# Patient Record
Sex: Male | Born: 1935 | Race: White | Hispanic: No | Marital: Married | Smoking: Former smoker
Health system: Southern US, Community
[De-identification: ages and names within clinical notes are randomized; demographics above are authoritative.]

## PROBLEM LIST (undated history)

## (undated) DIAGNOSIS — M069 Rheumatoid arthritis, unspecified: Secondary | ICD-10-CM

## (undated) DIAGNOSIS — I10 Essential (primary) hypertension: Secondary | ICD-10-CM

## (undated) DIAGNOSIS — K219 Gastro-esophageal reflux disease without esophagitis: Secondary | ICD-10-CM

## (undated) DIAGNOSIS — M0520 Rheumatoid vasculitis with rheumatoid arthritis of unspecified site: Secondary | ICD-10-CM

## (undated) HISTORY — PX: HERNIA REPAIR: SHX51

## (undated) HISTORY — PX: APPENDECTOMY: SHX54

## (undated) HISTORY — PX: BACK SURGERY: SHX140

## (undated) HISTORY — PX: TONSILLECTOMY: SUR1361

## (undated) HISTORY — PX: COLON SURGERY: SHX602

---

## 2009-09-24 ENCOUNTER — Ambulatory Visit: Payer: Self-pay | Admitting: Rheumatology

## 2009-12-25 ENCOUNTER — Ambulatory Visit: Payer: Self-pay | Admitting: Otolaryngology

## 2010-04-08 ENCOUNTER — Ambulatory Visit: Payer: Self-pay | Admitting: Oncology

## 2010-04-30 ENCOUNTER — Ambulatory Visit: Payer: Self-pay | Admitting: Oncology

## 2010-05-08 ENCOUNTER — Ambulatory Visit: Payer: Self-pay | Admitting: Oncology

## 2010-06-08 ENCOUNTER — Ambulatory Visit: Payer: Self-pay | Admitting: Oncology

## 2010-07-09 ENCOUNTER — Ambulatory Visit: Payer: Self-pay | Admitting: Oncology

## 2010-08-08 ENCOUNTER — Ambulatory Visit: Payer: Self-pay | Admitting: Oncology

## 2010-09-08 ENCOUNTER — Ambulatory Visit: Payer: Self-pay | Admitting: Oncology

## 2013-04-23 ENCOUNTER — Ambulatory Visit: Payer: Self-pay | Admitting: Rheumatology

## 2014-06-20 ENCOUNTER — Emergency Department: Payer: Self-pay | Admitting: Emergency Medicine

## 2014-06-20 LAB — URINALYSIS, COMPLETE
BACTERIA: NONE SEEN
BLOOD: NEGATIVE
Bilirubin,UR: NEGATIVE
GLUCOSE, UR: NEGATIVE mg/dL (ref 0–75)
Ketone: NEGATIVE
Leukocyte Esterase: NEGATIVE
NITRITE: NEGATIVE
PH: 5 (ref 4.5–8.0)
Protein: NEGATIVE
RBC, UR: NONE SEEN /HPF (ref 0–5)
SPECIFIC GRAVITY: 1.011 (ref 1.003–1.030)
Squamous Epithelial: <1

## 2014-06-20 LAB — CBC WITH DIFFERENTIAL/PLATELET
Basophil #: 0 10*3/uL (ref 0.0–0.1)
Basophil %: 0.7 %
Eosinophil #: 0.3 10*3/uL (ref 0.0–0.7)
Eosinophil %: 4.7 %
HCT: 33.9 % — ABNORMAL LOW (ref 40.0–52.0)
HGB: 11.4 g/dL — ABNORMAL LOW (ref 13.0–18.0)
LYMPHS ABS: 1.5 10*3/uL (ref 1.0–3.6)
Lymphocyte %: 27.2 %
MCH: 32 pg (ref 26.0–34.0)
MCHC: 33.8 g/dL (ref 32.0–36.0)
MCV: 95 fL (ref 80–100)
MONO ABS: 0.2 x10 3/mm (ref 0.2–1.0)
Monocyte %: 3.9 %
NEUTROS ABS: 3.5 10*3/uL (ref 1.4–6.5)
NEUTROS PCT: 63.5 %
PLATELETS: 271 10*3/uL (ref 150–440)
RBC: 3.57 10*6/uL — ABNORMAL LOW (ref 4.40–5.90)
RDW: 16.5 % — ABNORMAL HIGH (ref 11.5–14.5)
WBC: 5.5 10*3/uL (ref 3.8–10.6)

## 2014-06-20 LAB — COMPREHENSIVE METABOLIC PANEL
ALT: 45 U/L
Albumin: 3.6 g/dL (ref 3.4–5.0)
Alkaline Phosphatase: 93 U/L
Anion Gap: 9 (ref 7–16)
BUN: 19 mg/dL — ABNORMAL HIGH (ref 7–18)
Bilirubin,Total: 0.4 mg/dL (ref 0.2–1.0)
CALCIUM: 9.2 mg/dL (ref 8.5–10.1)
CO2: 23 mmol/L (ref 21–32)
Chloride: 107 mmol/L (ref 98–107)
Creatinine: 1.47 mg/dL — ABNORMAL HIGH (ref 0.60–1.30)
EGFR (African American): 53 — ABNORMAL LOW
EGFR (Non-African Amer.): 46 — ABNORMAL LOW
Glucose: 121 mg/dL — ABNORMAL HIGH (ref 65–99)
OSMOLALITY: 281 (ref 275–301)
Potassium: 3.4 mmol/L — ABNORMAL LOW (ref 3.5–5.1)
SGOT(AST): 27 U/L (ref 15–37)
SODIUM: 139 mmol/L (ref 136–145)
TOTAL PROTEIN: 7.4 g/dL (ref 6.4–8.2)

## 2016-08-15 ENCOUNTER — Encounter (HOSPITAL_COMMUNITY): Payer: Self-pay

## 2016-08-15 ENCOUNTER — Emergency Department (HOSPITAL_COMMUNITY)
Admission: EM | Admit: 2016-08-15 | Discharge: 2016-08-15 | Disposition: A | Discharge status: Home / Self Care | Payer: Medicare Other | Attending: Emergency Medicine | Admitting: Emergency Medicine

## 2016-08-15 DIAGNOSIS — I10 Essential (primary) hypertension: Secondary | ICD-10-CM | POA: Insufficient documentation

## 2016-08-15 DIAGNOSIS — Z87891 Personal history of nicotine dependence: Secondary | ICD-10-CM | POA: Insufficient documentation

## 2016-08-15 DIAGNOSIS — L03211 Cellulitis of face: Secondary | ICD-10-CM

## 2016-08-15 DIAGNOSIS — R220 Localized swelling, mass and lump, head: Secondary | ICD-10-CM | POA: Diagnosis present

## 2016-08-15 HISTORY — DX: Essential (primary) hypertension: I10

## 2016-08-15 HISTORY — DX: Gastro-esophageal reflux disease without esophagitis: K21.9

## 2016-08-15 HISTORY — DX: Rheumatoid arthritis, unspecified: M06.9

## 2016-08-15 HISTORY — DX: Rheumatoid vasculitis with rheumatoid arthritis of unspecified site: M05.20

## 2016-08-15 MED ORDER — SULFAMETHOXAZOLE-TRIMETHOPRIM 800-160 MG PO TABS: 1.0000 | ORAL_TABLET | Freq: Two times a day (BID) | ORAL | 0 refills | Status: AC

## 2016-08-15 MED ORDER — SULFAMETHOXAZOLE-TRIMETHOPRIM 800-160 MG PO TABS
1.0000 | ORAL_TABLET | Freq: Once | ORAL | Status: AC
  Administered 2016-08-15: 1 via ORAL
  Filled 2016-08-15: qty 1

## 2016-08-15 NOTE — ED Triage Notes (Signed)
Pt reports that he has had redness to right cheek area for 2 weeks.Area started swelling last night. No pain

## 2016-08-15 NOTE — ED Provider Notes (Signed)
[facility] Provider Note   CSN: [id number] Arrival date & time: 08/15/16  0919     History   Chief Complaint Chief Complaint  Patient presents with  . Abscess    HPI William Tyler is a 81 y.o. male.  HPI   William Tyler is a 81 y.o. male who presents to the Emergency Department complaining of localized redness of the right cheek. Symptoms have been present for 2 weeks. He states this morning that he woke up and noticed the area of redness seemed thicker and swollen.  He denies pain to the area, fever, chills, dental pain or ear pain. He also denies any visual changes.  He has not tried any therapies for his symptoms. He does state that he occasionally switches from using an Neurosurgeonelectric razor to a regular razor   Past Medical History:  Diagnosis Date  . GERD (gastroesophageal reflux disease)   . Hypertension   . Rheumatoid arteritis   . Rheumatoid arthritis (HCC)     There are no active problems to display for this patient.   Past Surgical History:  Procedure Laterality Date  . APPENDECTOMY    . BACK SURGERY    . COLON SURGERY    . HERNIA REPAIR    . TONSILLECTOMY         Home Medications    Prior to Admission medications   Not on File    Family History History reviewed. No pertinent family history.  Social History Social History  Substance Use Topics  . Smoking status: Former Games developermoker  . Smokeless tobacco: Never Used  . Alcohol use No     Allergies   Augmentin [amoxicillin-pot clavulanate] and Doxycycline   Review of Systems Review of Systems  Constitutional: Negative for chills and fever.  Gastrointestinal: Negative for nausea and vomiting.  Musculoskeletal: Negative for arthralgias and joint swelling.  Skin: Positive for color change.       Redness of the right cheek  Neurological: Negative for dizziness, speech difficulty, weakness and headaches.  Hematological: Negative for adenopathy.  All other systems reviewed and are  negative.    Physical Exam Updated Vital Signs BP 139/71 (BP Location: Left Arm)   Pulse (!) 54   Temp 98.9 F (37.2 C) (Oral)   Resp 16   Ht 5\' 9"  (1.753 m)   Wt 83.9 kg   SpO2 98%   BMI 27.32 kg/m   Physical Exam  Constitutional: He is oriented to person, place, and time. He appears well-developed and well-nourished. No distress.  HENT:  Head: Normocephalic and atraumatic.    Right Ear: Tympanic membrane and ear canal normal.  Left Ear: Tympanic membrane and ear canal normal.  Mouth/Throat: Uvula is midline, oropharynx is clear and moist and mucous membranes are normal. No trismus in the jaw.  5 cm area of focal erythema and mild induration of the right face. No fluctuance or drainable abscess noted. Erythema does not extend into the orbital region. No dental tenderness or fluctuance of the gingiva   Eyes: Conjunctivae and EOM are normal. Pupils are equal, round, and reactive to light.  Neck: Normal range of motion. Neck supple. No tracheal deviation present.  Cardiovascular: Normal rate, regular rhythm and normal heart sounds.   No murmur heard. Pulmonary/Chest: Effort normal and breath sounds normal. No respiratory distress.  Lymphadenopathy:    He has no cervical adenopathy.  Neurological: He is alert and oriented to person, place, and time. Abnormal muscle tone:   Coordination normal.  Skin: Skin is warm and dry.  Psychiatric: He has a normal mood and affect.  Nursing note and vitals reviewed.    ED Treatments / Results  Labs (all labs ordered are listed, but only abnormal results are displayed) Labs Reviewed - No data to display  EKG  EKG Interpretation None       Radiology No results found.  Procedures Procedures (including critical care time)  Medications Ordered in ED Medications - No data to display   Initial Impression / Assessment and Plan / ED Course  I have reviewed the triage vital signs and the nursing notes.  Pertinent labs &  imaging results that were available during my care of the patient were reviewed by me and considered in my medical decision making (see chart for details).  Clinical Course    Patient is well-appearing. Vital signs are stable. Localized erythema of the right face without obvious abscess, consistent with cellulitis. Does not extend into the orbital region.  Patient also seen by Dr. Clayborne Dana and care plan discussed.  Patient stable for discharge, he agrees to warm compresses prescription for Bactrim and agrees to ER return if needed.  Final Clinical Impressions(s) / ED Diagnoses   Final diagnoses:  Cellulitis of face    New Prescriptions New Prescriptions   No medications on file     Pauline Aus, PA-C 08/15/16 1039    Marily Memos, MD 08/15/16 [phone]

## 2016-08-15 NOTE — ED Provider Notes (Signed)
Medical screening examination/treatment/procedure(s) were conducted as a shared visit with non-physician practitioner(s) and myself.  I personally evaluated the patient during the encounter.   A couple weeks of redness and tenderness under right eye. Yesterday became 'swollen' and patient concerned for abscess so came for eval.  On my eval, it is indurated without fluctuance. No EOM pain. No change in vision. No dental issues.  Likely cellulitis, low concern for abscess. Plan for outpatient antibiotics. Return if not improvig in 3-5 days or starts to feel fluctuance.    Marily MemosJason Kieley Akter, MD 08/15/16 [phone]

## 2016-08-15 NOTE — Discharge Instructions (Signed)
Frequent warm wet compresses on and off to your face.  Follow-up with your primary doctor or return here if symptoms are not improving.

## 2019-11-07 ENCOUNTER — Other Ambulatory Visit: Payer: Self-pay | Admitting: Rheumatology

## 2019-11-07 DIAGNOSIS — M25512 Pain in left shoulder: Secondary | ICD-10-CM

## 2019-11-11 ENCOUNTER — Ambulatory Visit
Admission: RE | Admit: 2019-11-11 | Discharge: 2019-11-11 | Disposition: A | Discharge status: Home / Self Care | Payer: Medicare HMO | Source: Ambulatory Visit | Attending: Rheumatology | Admitting: Rheumatology

## 2019-11-11 ENCOUNTER — Other Ambulatory Visit: Payer: Self-pay

## 2019-11-11 DIAGNOSIS — M25512 Pain in left shoulder: Secondary | ICD-10-CM | POA: Diagnosis present

## 2020-06-18 ENCOUNTER — Other Ambulatory Visit: Payer: Self-pay

## 2020-06-18 ENCOUNTER — Encounter: Payer: Self-pay | Admitting: Urology

## 2020-06-18 ENCOUNTER — Ambulatory Visit: Payer: Medicare HMO | Admitting: Urology

## 2020-06-18 VITALS — BP 98/59 | HR 116 | Ht 69.0 in | Wt 185.0 lb

## 2020-06-18 DIAGNOSIS — N5201 Erectile dysfunction due to arterial insufficiency: Secondary | ICD-10-CM

## 2020-06-18 NOTE — Progress Notes (Signed)
06/18/2020 3:15 PM   William Tyler 1935/08/29 [id number]  Referring provider: Alvina Filbert, MD [address] [place],  [place] [address]  Chief Complaint  Patient presents with   Erectile Dysfunction    HPI: William Tyler is an 85 y.o. male seen at the request of Alvina Filbert, MD for evaluation of erectile dysfunction.   Long history of a ED, was using Viagra which was effective however at age 57 he could no longer afford  Complains of difficulty achieving and maintaining an erection  Has been prescribed sildenafil 20 mg daily and tadalafil 5 mg daily which has not been effective  Has taken up to 60 mg sildenafil and 10 mg tadalafil without improvement  No pain or curvature with erections  Organic risk factors include hypertension, antihypertensive medications and tobacco history  PMH: Past Medical History:  Diagnosis Date   GERD (gastroesophageal reflux disease)    Hypertension    Rheumatoid arteritis (HCC)    Rheumatoid arthritis (HCC)     Surgical History: Past Surgical History:  Procedure Laterality Date   APPENDECTOMY     BACK SURGERY     COLON SURGERY     HERNIA REPAIR     TONSILLECTOMY      Home Medications:  Allergies as of 06/18/2020      Reactions   Augmentin [amoxicillin-pot Clavulanate] Rash   Doxycycline Rash      Medication List       Accurate as of June 18, 2020  3:15 PM. If you have any questions, ask your nurse or doctor.        amLODipine 10 MG tablet Commonly known as: NORVASC Take by mouth.   fluticasone 50 MCG/ACT nasal spray Commonly known as: FLONASE   GNP Vitamin B-1 100 MG tablet Generic drug: thiamine   hydrochlorothiazide 12.5 MG tablet Commonly known as: HYDRODIURIL hydrochlorothiazide 12.5 mg tablet   hydrocortisone 20 MG tablet Commonly known as: CORTEF hydrocortisone 20 mg tablet   lansoprazole 30 MG capsule Commonly known as: PREVACID Take 1 capsule by mouth 2 (two) times daily.     methotrexate 2.5 MG tablet Commonly known as: RHEUMATREX methotrexate sodium 2.5 mg tablet   tamsulosin 0.4 MG Caps capsule Commonly known as: FLOMAX Take by mouth.       Allergies:  Allergies  Allergen Reactions   Augmentin [Amoxicillin-Pot Clavulanate] Rash   Doxycycline Rash    Family History: No family history on file.  Social History:  reports that he has quit smoking. He has never used smokeless tobacco. He reports that he does not drink alcohol and does not use drugs.   Physical Exam: BP (!) 98/59    Pulse (!) 116    Ht 5\' 9"  (1.753 m)    Wt 185 lb (83.9 kg)    BMI 27.32 kg/m   Constitutional:  Alert and oriented, No acute distress. HEENT: [place] AT, moist mucus membranes.  Trachea midline, no masses. Cardiovascular: No clubbing, cyanosis, or edema. Respiratory: Normal respiratory effort, no increased work of breathing. GI: Abdomen is soft, nontender, nondistended, no abdominal masses GU: Phallus without lesions, testes descended bilaterally Skin: No rashes, bruises or suspicious lesions. Neurologic: Grossly intact, no focal deficits, moving all 4 extremities. Psychiatric: Normal mood and affect.   Assessment & Plan:    1.  Erectile dysfunction  No improvement: PDE 5 inhibitors however his dosing has been suboptimal  He was informed he could titrate sildenafil to a max of 100 mg or tadalafil to  a max of 20 mg.  He states he does have some of the medication left and will call back if he needs refill  Other options were discussed including intracavernosal injections, vacuum erection devices and penile implant surgery.  He was provided literature and will call back if interested in pursuing any of these options.   Riki Altes, MD  [facility] [address] [place], [place] [address] [phone]

## 2020-06-19 ENCOUNTER — Encounter: Payer: Self-pay | Admitting: Urology

## 2021-05-20 IMAGING — MR MR SHOULDER*L* W/O CM
6 series · 40 of 40 positions shown · non-contrast
Comparison: None.

CLINICAL DATA: Left shoulder pain for 3 months

EXAM:
MRI OF THE LEFT SHOULDER WITHOUT CONTRAST
TECHNIQUE: Multiplanar, multisequence MR imaging of the shoulder was performed.
No intravenous contrast was administered.

[Series 3: PD fat-sat · axial · left · 4.0mm · 0.55mm/px · z∈[-34,+81]mm · 8 of 25 slices shown (1 of 2)]
[im 1/25]
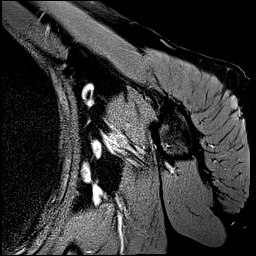
[im 4/25]
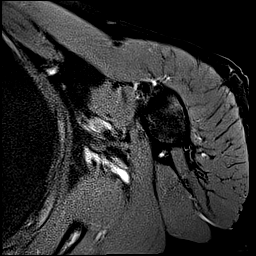
[im 7/25]
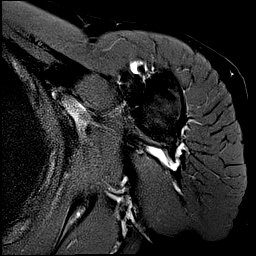
[im 11/25]
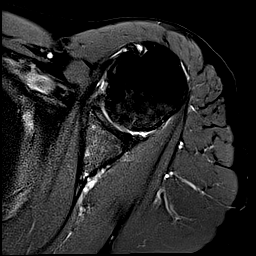
[im 14/25]
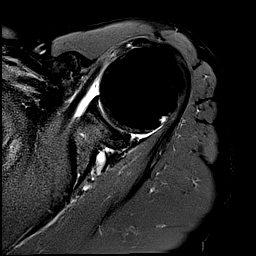
[im 18/25]
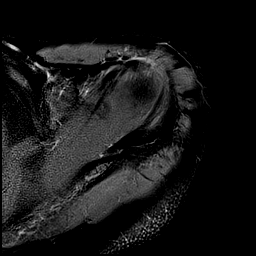
[im 21/25]
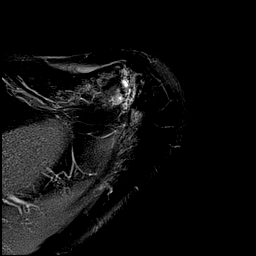
[im 25/25]
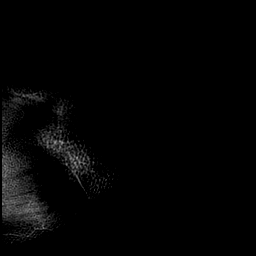

[Series 4: PD fat-sat · oblique · left · 4.0mm · 0.44mm/px · 7 of 26 slices shown (2 of 2)]
[im 1/26]
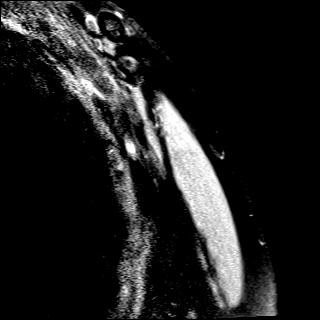
[im 5/26]
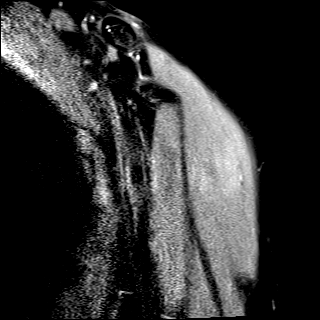
[im 9/26]
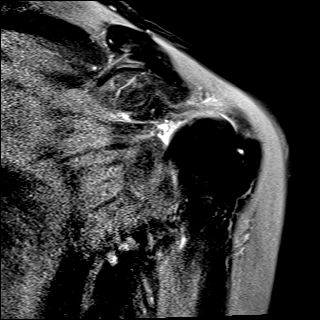
[im 13/26]
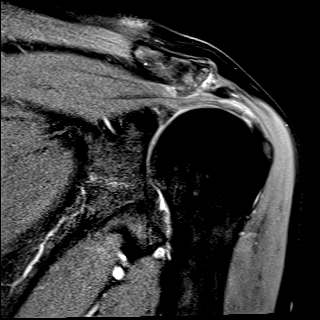
[im 17/26]
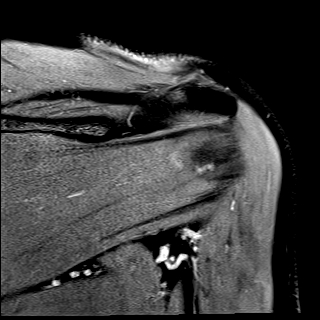
[im 21/26]
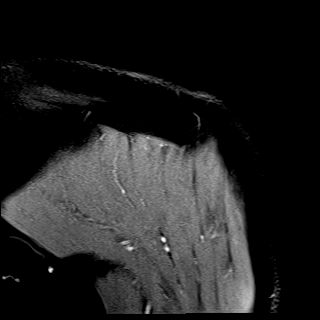
[im 26/26]
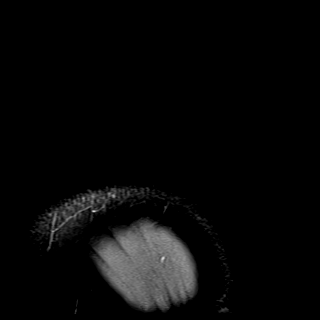

[Series 5: T2 fat-sat · oblique · left · 4.0mm · 0.44mm/px · 7 of 26 slices shown (1 of 3)]
[im 1/26]
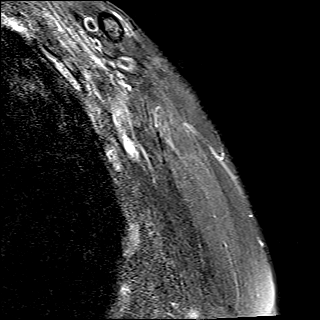
[im 5/26]
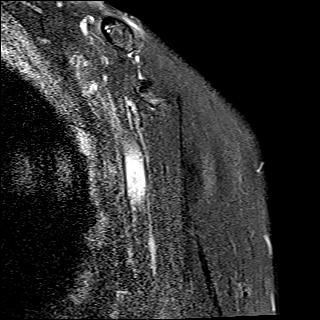
[im 9/26]
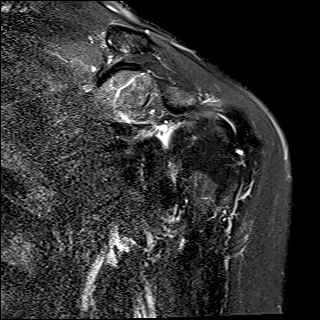
[im 13/26]
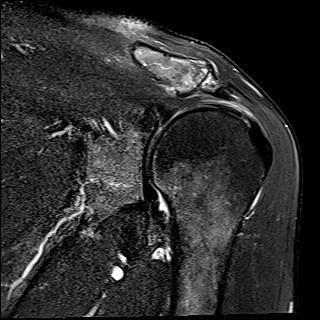
[im 17/26]
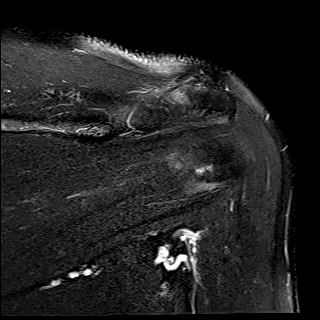
[im 21/26]
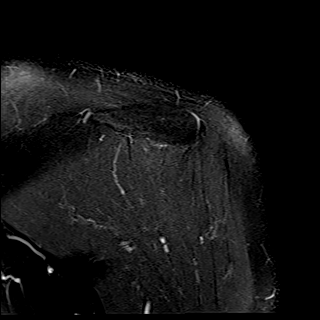
[im 26/26]
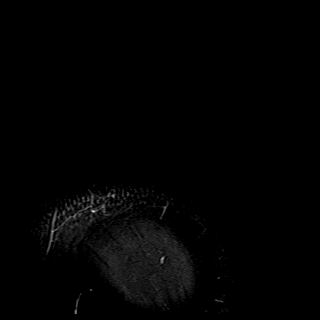

[Series 6: T2 fat-sat · oblique · left · 4.0mm · 0.27mm/px · 6 of 22 slices shown (2 of 3)]
[im 1/22]
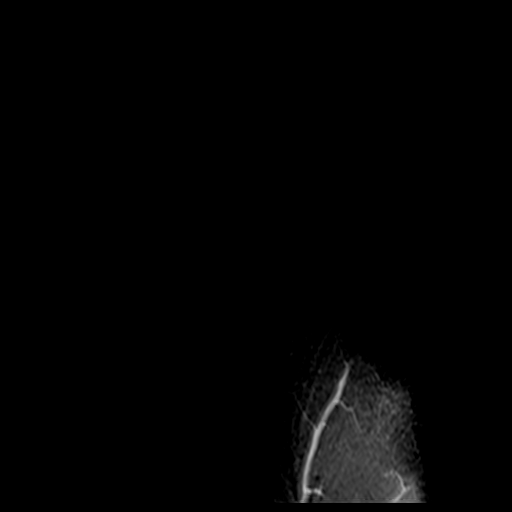
[im 5/22]
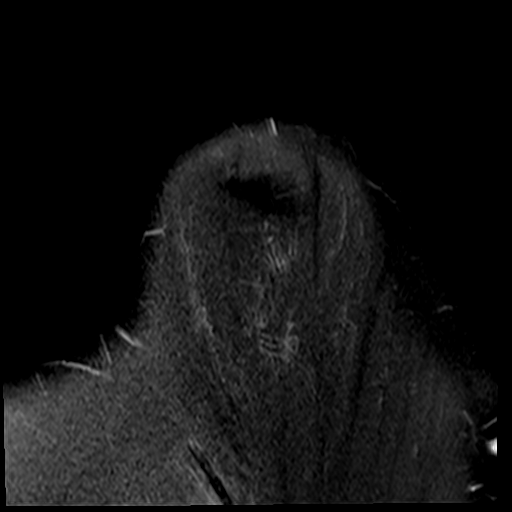
[im 9/22]
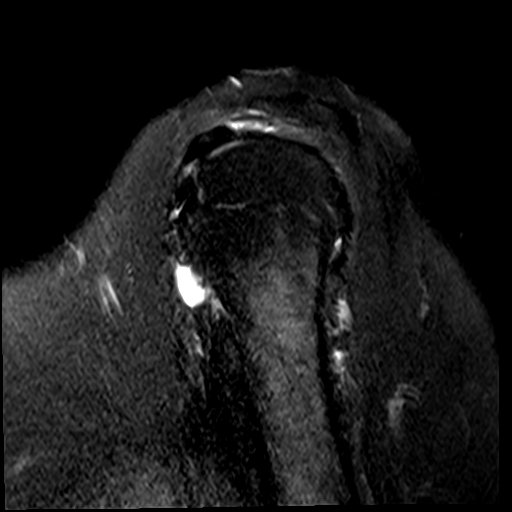
[im 13/22]
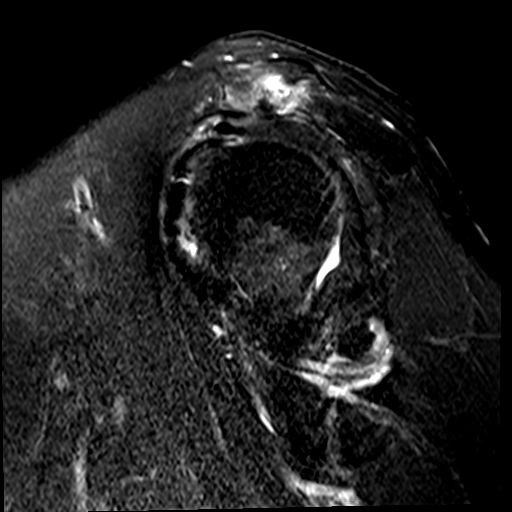
[im 17/22]
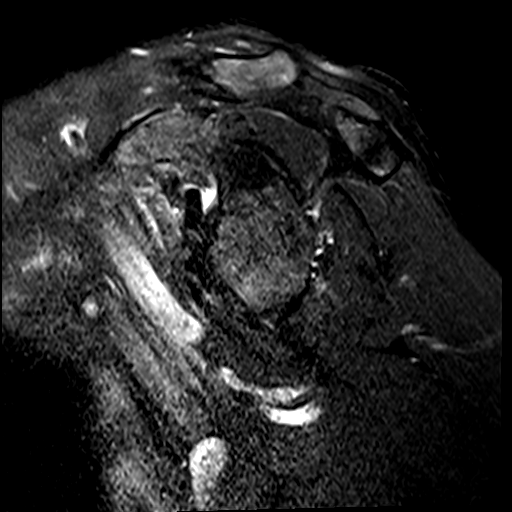
[im 22/22]
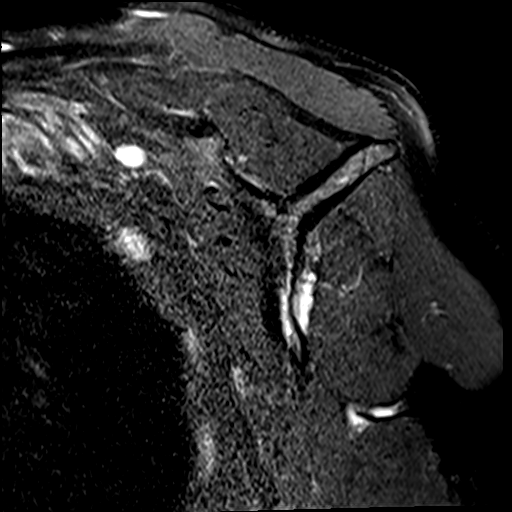

[Series 7: T1 · oblique · left · 4.0mm · 0.44mm/px · 6 of 22 slices shown]
[im 1/22]
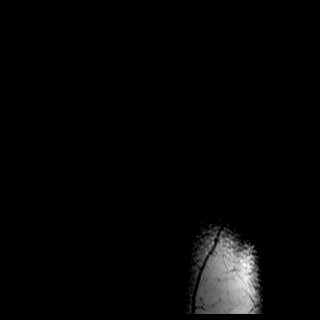
[im 5/22]
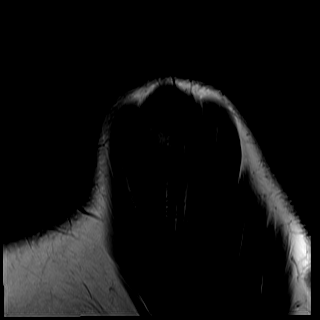
[im 9/22]
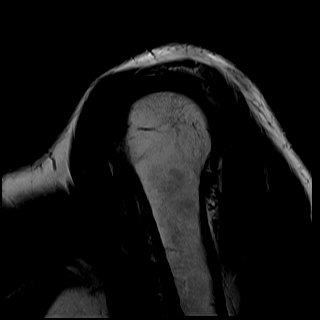
[im 13/22]
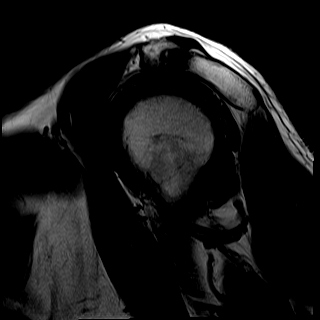
[im 17/22]
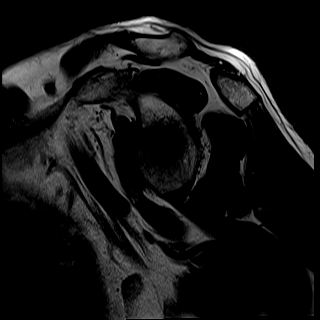
[im 22/22]
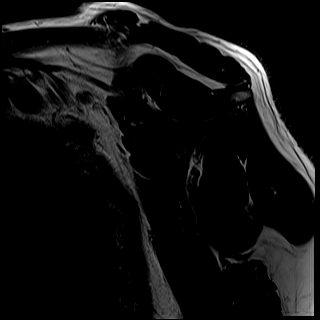

[Series 8: T2 fat-sat · oblique · left · 4.0mm · 0.55mm/px · 6 of 22 slices shown (3 of 3)]
[im 1/22]
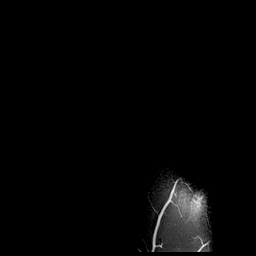
[im 5/22]
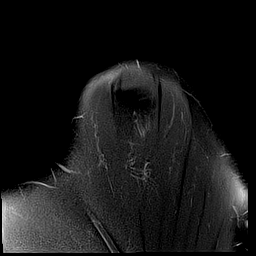
[im 9/22]
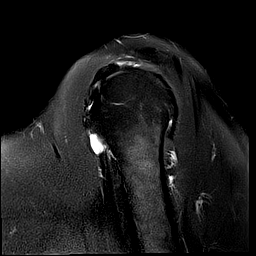
[im 13/22]
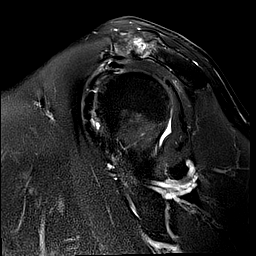
[im 17/22]
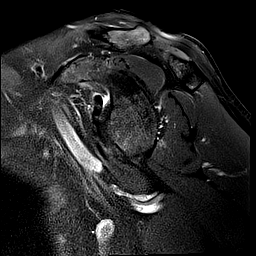
[im 22/22]
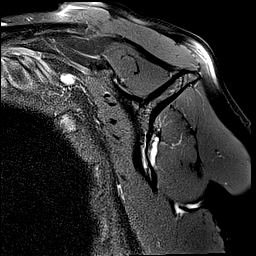

[40 of 40 positions shown; findings below may reference images not displayed]

FINDINGS: Rotator cuff: Mild supraspinatus tendinosis without discrete tear.
Subscapularis tendinosis with interstitial tear. Infraspinatus and
teres minor tendons are intact.

Muscles: No atrophy or abnormal signal of the muscles of the rotator
cuff.

Biceps long head:  Intra-articular biceps tendinosis.

Acromioclavicular Joint: Mild arthropathy of the acromioclavicular
joint. No subacromial/subdeltoid bursal fluid.

Glenohumeral Joint: Trace joint effusion. Mild chondral thinning
without discrete cartilage defect.

Labrum: Irregular posterior labral tear without definite involvement
of the superior labrum. Anteroinferior labrum appears mildly
degenerated.

Bones:  No marrow abnormality, fracture or dislocation.

Other: None.
IMPRESSION: 1. Subscapularis tendinosis with interstitial tear.
2. Mild supraspinatus tendinosis without discrete tear.
3. Intra-articular biceps tendinosis.
4. Mild glenohumeral osteoarthritis with trace joint effusion and
posterior labral tear.
5. Mild acromioclavicular osteoarthritis.

## 2022-01-14 ENCOUNTER — Other Ambulatory Visit
Admission: RE | Admit: 2022-01-14 | Discharge: 2022-01-14 | Disposition: A | Discharge status: Home / Self Care | Payer: Medicare HMO | Source: Ambulatory Visit | Attending: Rheumatology | Admitting: Rheumatology

## 2022-01-14 DIAGNOSIS — M25561 Pain in right knee: Secondary | ICD-10-CM | POA: Insufficient documentation

## 2022-01-14 LAB — SYNOVIAL CELL COUNT + DIFF, W/ CRYSTALS
Crystals, Fluid: NONE SEEN
Eosinophils-Synovial: 0 %
Lymphocytes-Synovial Fld: 25 %
Monocyte-Macrophage-Synovial Fluid: 45 %
Neutrophil, Synovial: 30 %
Other Cells-SYN: 0
WBC, Synovial: 50 /mm3 (ref 0–200)

## 2022-05-18 ENCOUNTER — Ambulatory Visit (INDEPENDENT_AMBULATORY_CARE_PROVIDER_SITE_OTHER): Payer: Medicare HMO | Admitting: Nurse Practitioner

## 2022-05-18 ENCOUNTER — Encounter (INDEPENDENT_AMBULATORY_CARE_PROVIDER_SITE_OTHER): Payer: Self-pay | Admitting: Vascular Surgery

## 2022-05-18 VITALS — BP 154/78 | HR 89 | Resp 17 | Ht 69.0 in | Wt 170.0 lb

## 2022-05-18 DIAGNOSIS — I7143 Infrarenal abdominal aortic aneurysm, without rupture: Secondary | ICD-10-CM | POA: Diagnosis not present

## 2022-05-18 DIAGNOSIS — R29898 Other symptoms and signs involving the musculoskeletal system: Secondary | ICD-10-CM | POA: Diagnosis not present

## 2022-05-18 MED ORDER — CAPSAICIN 0.075 % EX CREA: 1.0000 | TOPICAL_CREAM | Freq: Two times a day (BID) | CUTANEOUS | 0 refills | Status: AC

## 2022-05-30 NOTE — Progress Notes (Signed)
Subjective:    Patient ID: William Tyler, male    DOB: 11-21-1934, 87 y.o.   MRN: [id number] Chief Complaint  Patient presents with   Establish Care    Referred by William Tyler is an 87 year old male who presents today for evaluation of an abdominal aortic aneurysm.  The abdominal aorta was measured at 2.6 cm at its largest point.  There was a 1.1 cm left iliac artery measurement with a 1.5 cm right common iliac artery measurement.  The patient is also largely concerned that he may have vascular issues as during an ultrasound he was told that he had a blockages in his aorta.  The patient notes that he has severe fatigue and weakness with walking.  He notes that this happened after hospitalization for COVID.  This has been sometime ago but the patient notes that he never fully felt like himself once again.  He notes that he gets extremely tired with ambulation.  He does not have any claudication-like symptoms.  He also notes that his legs feel heavy at times.  He believes that there is a vascular related issue for these symptoms.    Review of Systems     Objective:   Physical Exam  BP (!) 154/78 (BP Location: Right Arm)   Pulse 89   Resp 17   Ht 5\' 9"  (1.753 m)   Wt 170 lb (77.1 kg)   BMI 25.10 kg/m   Past Medical History:  Diagnosis Date   GERD (gastroesophageal reflux disease)    Hypertension    Rheumatoid arteritis (HCC)    Rheumatoid arthritis (HCC)     Social History   Socioeconomic History   Marital status: Married    Spouse name: Not on file   Number of children: Not on file   Years of education: Not on file   Highest education level: Not on file  Occupational History   Not on file  Tobacco Use   Smoking status: Former   Smokeless tobacco: Never  Substance and Sexual Activity   Alcohol use: No   Drug use: No   Sexual activity: Not on file  Other Topics Concern   Not on file  Social History Narrative   Not on file   Social Determinants of  Health   Financial Resource Strain: Not on file  Food Insecurity: Not on file  Transportation Needs: Not on file  Physical Activity: Not on file  Stress: Not on file  Social Connections: Not on file  Intimate Partner Violence: Not on file    Past Surgical History:  Procedure Laterality Date   APPENDECTOMY     BACK SURGERY     COLON SURGERY     HERNIA REPAIR     TONSILLECTOMY      History reviewed. No pertinent family history.  Allergies  Allergen Reactions   Augmentin [Amoxicillin-Pot Clavulanate] Rash   Doxycycline Rash       Latest Ref Rng & Units 06/20/2014    9:57 PM  CBC  WBC 3.8 - 10.6 x10 3/mm 3 5.5   Hemoglobin 13.0 - 18.0 g/dL 06/22/2014   Hematocrit 10.6 - 52.0 % 33.9   Platelets 150 - 440 x10 3/mm 3 271       CMP     Component Value Date/Time   NA 139 06/20/2014 2157   K 3.4 (L) 06/20/2014 2157   CL 107 06/20/2014 2157   CO2 23 06/20/2014 2157   GLUCOSE 121 (  H) 06/20/2014 2157   BUN 19 (H) 06/20/2014 2157   CREATININE 1.47 (H) 06/20/2014 2157   CALCIUM 9.2 06/20/2014 2157   PROT 7.4 06/20/2014 2157   ALBUMIN 3.6 06/20/2014 2157   AST 27 06/20/2014 2157   ALT 45 06/20/2014 2157   ALKPHOS 93 06/20/2014 2157   BILITOT 0.4 06/20/2014 2157   GFRNONAA 46 (L) 06/20/2014 2157   GFRAA 53 (L) 06/20/2014 2157     No results found.     Assessment & Plan:   1. Infrarenal abdominal aortic aneurysm (AAA) without rupture (HCC) The patient's aneurysm measures 2.6 cm at its largest portion.  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  Based on this the patient has what is considered mildly ectatic.  We will reevaluate noninvasive studies in 1 year.  2. Weakness of both lower extremities The patient was told by the ultrasound technician that he had a blockage in his aorta.  The results do not indicate any significant stenosis but the patient is concerned that he has a vascular issue that may be causing his fatigue and weakness.  We will  have the patient undergo ABIs as well as bilateral lower extremity venous reflux studies to evaluate for possible causes.  I suspect however the cause of the patient's weakness are his extended hospitalization.   Current Outpatient Medications on File Prior to Visit  Medication Sig Dispense Refill   amLODipine (NORVASC) 10 MG tablet Take by mouth.     apixaban (ELIQUIS) 5 MG TABS tablet Take by mouth.     furosemide (LASIX) 40 MG tablet Take by mouth.     GNP VITAMIN B-1 100 MG tablet      hydrochlorothiazide (HYDRODIURIL) 12.5 MG tablet hydrochlorothiazide 12.5 mg tablet     hydroxychloroquine (PLAQUENIL) 200 MG tablet Take 200 mg by mouth 2 (two) times daily.     lansoprazole (PREVACID) 30 MG capsule Take 1 capsule by mouth 2 (two) times daily.     ondansetron (ZOFRAN) 4 MG tablet 4 mg every 8 (eight) hours as needed     tamsulosin (FLOMAX) 0.4 MG CAPS capsule Take by mouth.     fluticasone (FLONASE) 50 MCG/ACT nasal spray  (Patient not taking: Reported on 05/18/2022)     hydrocortisone (CORTEF) 20 MG tablet hydrocortisone 20 mg tablet (Patient not taking: Reported on 05/18/2022)     methotrexate (RHEUMATREX) 2.5 MG tablet methotrexate sodium 2.5 mg tablet (Patient not taking: Reported on 05/18/2022)     No current facility-administered medications on file prior to visit.    There are no Patient Instructions on file for this visit. No follow-ups on file.   Georgiana Spinner, NP

## 2022-05-31 ENCOUNTER — Encounter (INDEPENDENT_AMBULATORY_CARE_PROVIDER_SITE_OTHER): Payer: Self-pay | Admitting: Nurse Practitioner

## 2022-06-17 ENCOUNTER — Other Ambulatory Visit (INDEPENDENT_AMBULATORY_CARE_PROVIDER_SITE_OTHER): Payer: Self-pay | Admitting: Nurse Practitioner

## 2022-06-17 DIAGNOSIS — R29898 Other symptoms and signs involving the musculoskeletal system: Secondary | ICD-10-CM

## 2022-06-18 ENCOUNTER — Ambulatory Visit (INDEPENDENT_AMBULATORY_CARE_PROVIDER_SITE_OTHER): Payer: Medicare HMO

## 2022-06-18 ENCOUNTER — Encounter (INDEPENDENT_AMBULATORY_CARE_PROVIDER_SITE_OTHER): Payer: Self-pay | Admitting: Nurse Practitioner

## 2022-06-18 ENCOUNTER — Ambulatory Visit (INDEPENDENT_AMBULATORY_CARE_PROVIDER_SITE_OTHER): Payer: Medicare HMO | Admitting: Nurse Practitioner

## 2022-06-18 VITALS — BP 116/74 | HR 103 | Resp 17 | Ht 69.0 in | Wt 162.8 lb

## 2022-06-18 DIAGNOSIS — I872 Venous insufficiency (chronic) (peripheral): Secondary | ICD-10-CM

## 2022-06-18 DIAGNOSIS — R531 Weakness: Secondary | ICD-10-CM | POA: Diagnosis not present

## 2022-06-18 DIAGNOSIS — R9439 Abnormal result of other cardiovascular function study: Secondary | ICD-10-CM

## 2022-06-18 DIAGNOSIS — I7143 Infrarenal abdominal aortic aneurysm, without rupture: Secondary | ICD-10-CM

## 2022-06-18 DIAGNOSIS — R29898 Other symptoms and signs involving the musculoskeletal system: Secondary | ICD-10-CM

## 2022-06-18 NOTE — Progress Notes (Signed)
Subjective:    Patient ID: William Tyler, male    DOB: 07-27-1935, 87 y.o.   MRN: [id number] No chief complaint on file.   William Tyler is an 87 year old male who presents today for evaluation of an abdominal aortic aneurysm.  The abdominal aorta was measured at 2.6 cm at its largest point.  There was a 1.1 cm left iliac artery measurement with a 1.5 cm right common iliac artery measurement.  The patient is also largely concerned that he may have vascular issues as during an ultrasound he was told that he had a blockages in his aorta.  The patient notes that he has severe generalized fatigue.  He notes that it feels as if he has no energy for anything.  He notes that this happened after hospitalization for this has been sometime ago but the patient notes that he never fully felt like himself once again.  He was hospitalized for over 30 days.  He notes that during this time he walked very little.  He also notes that he has been eating much less recently.  He attributes this to lack of appetite.  He notes that he gets extremely tired with ambulation.  He does not have any claudication-like symptoms.  He also notes that his legs feel heavy at times.  He believes that there is a vascular related issue for these symptoms.  Today noninvasive studies show no evidence of DVT or superficial thrombophlebitis bilaterally.  There is evidence of deep venous insufficiency on the right.  There is also evidence of superficial venous reflux bilaterally.  Today noninvasive studies also show an ABI of 1.01 on the right and 1.07 on the left.  His toe pressure 96 on the right and 76 on the left.  The waveforms overall are triphasic.  Slightly dampened toe waveforms on the left.    Review of Systems  Neurological:  Positive for weakness.  All other systems reviewed and are negative.      Objective:   Physical Exam Vitals reviewed.  HENT:     Head: Normocephalic.  Cardiovascular:     Rate and Rhythm: Normal rate.      Pulses: Normal pulses.  Pulmonary:     Effort: Pulmonary effort is normal.  Skin:    General: Skin is warm and dry.  Neurological:     Mental Status: He is alert and oriented to person, place, and time.  Psychiatric:        Mood and Affect: Mood normal.        Behavior: Behavior normal.        Thought Content: Thought content normal.        Judgment: Judgment normal.     BP 116/74 (BP Location: Right Arm)   Pulse (!) 103   Resp 17   Ht 5\' 9"  (1.753 m)   Wt 162 lb 12.8 oz (73.8 kg)   BMI 24.04 kg/m   Past Medical History:  Diagnosis Date   GERD (gastroesophageal reflux disease)    Hypertension    Rheumatoid arteritis (HCC)    Rheumatoid arthritis (HCC)     Social History   Socioeconomic History   Marital status: Married    Spouse name: Not on file   Number of children: Not on file   Years of education: Not on file   Highest education level: Not on file  Occupational History   Not on file  Tobacco Use   Smoking status: Former   Smokeless tobacco: Never  Substance and Sexual Activity   Alcohol use: No   Drug use: No   Sexual activity: Not on file  Other Topics Concern   Not on file  Social History Narrative   Not on file   Social Determinants of Health   Financial Resource Strain: Not on file  Food Insecurity: Not on file  Transportation Needs: Not on file  Physical Activity: Not on file  Stress: Not on file  Social Connections: Not on file  Intimate Partner Violence: Not on file    Past Surgical History:  Procedure Laterality Date   APPENDECTOMY     BACK SURGERY     COLON SURGERY     HERNIA REPAIR     TONSILLECTOMY      History reviewed. No pertinent family history.  Allergies  Allergen Reactions   Augmentin [Amoxicillin-Pot Clavulanate] Rash   Doxycycline Rash       Latest Ref Rng & Units 06/20/2014    9:57 PM  CBC  WBC 3.8 - 10.6 x10 3/mm 3 5.5   Hemoglobin 13.0 - 18.0 g/dL 56.4   Hematocrit 33.2 - 52.0 % 33.9   Platelets 150  - 440 x10 3/mm 3 271       CMP     Component Value Date/Time   NA 139 06/20/2014 2157   K 3.4 (L) 06/20/2014 2157   CL 107 06/20/2014 2157   CO2 23 06/20/2014 2157   GLUCOSE 121 (H) 06/20/2014 2157   BUN 19 (H) 06/20/2014 2157   CREATININE 1.47 (H) 06/20/2014 2157   CALCIUM 9.2 06/20/2014 2157   PROT 7.4 06/20/2014 2157   ALBUMIN 3.6 06/20/2014 2157   AST 27 06/20/2014 2157   ALT 45 06/20/2014 2157   ALKPHOS 93 06/20/2014 2157   BILITOT 0.4 06/20/2014 2157   GFRNONAA 46 (L) 06/20/2014 2157   GFRAA 53 (L) 06/20/2014 2157     No results found.     Assessment & Plan:   1. Infrarenal abdominal aortic aneurysm (AAA) without rupture (HCC) Previous noninvasive studies do not show significant aortic diameter.  There is however some iliac level aneurysmal disease.  The patient return in 6 months for studies to evaluate for progression.  2. Generalized weakness In evaluating the patient's vascular status appears that his greatest concern is not necessarily with the weakness of the legs themselves but with just a generalized feeling of weakness and fatigue.  Recommended a multivitamin supplement.  The patient's small meals and lack of eating may also contribute.  Patient is advised to utilize Ensure or protein shakes when he does not feel like eating full meal.  Otherwise patient will continue to follow with PCP for management of fatigue.  3. Venous (peripheral) insufficiency We discussed the use of medical grade compression in setting of venous insufficiency.  We also discussed elevating his lower extremities as well as activity.  The patient is advised to continue with conservative therapy and we can reevaluate if an endovenous laser ablation would be appropriate for the patient has follow-up.    Current Outpatient Medications on File Prior to Visit  Medication Sig Dispense Refill   amLODipine (NORVASC) 10 MG tablet Take by mouth.     apixaban (ELIQUIS) 5 MG TABS tablet Take by  mouth.     capsicum (ZOSTRIX) 0.075 % topical cream Apply 1 Application topically 2 (two) times daily. 28.3 g 0   furosemide (LASIX) 40 MG tablet Take by mouth.     GNP VITAMIN B-1 100 MG tablet  hydrochlorothiazide (HYDRODIURIL) 12.5 MG tablet hydrochlorothiazide 12.5 mg tablet     hydroxychloroquine (PLAQUENIL) 200 MG tablet Take 200 mg by mouth 2 (two) times daily.     lansoprazole (PREVACID) 30 MG capsule Take 1 capsule by mouth 2 (two) times daily.     tamsulosin (FLOMAX) 0.4 MG CAPS capsule Take by mouth.     fluticasone (FLONASE) 50 MCG/ACT nasal spray  (Patient not taking: Reported on 05/18/2022)     hydrocortisone (CORTEF) 20 MG tablet hydrocortisone 20 mg tablet (Patient not taking: Reported on 05/18/2022)     methotrexate (RHEUMATREX) 2.5 MG tablet methotrexate sodium 2.5 mg tablet (Patient not taking: Reported on 05/18/2022)     ondansetron (ZOFRAN) 4 MG tablet 4 mg every 8 (eight) hours as needed (Patient not taking: Reported on 06/18/2022)     No current facility-administered medications on file prior to visit.    There are no Patient Instructions on file for this visit. No follow-ups on file.   Georgiana Spinner, NP

## 2022-08-31 ENCOUNTER — Other Ambulatory Visit
Admission: RE | Admit: 2022-08-31 | Discharge: 2022-08-31 | Disposition: A | Discharge status: Home / Self Care | Payer: Medicare HMO | Source: Ambulatory Visit | Attending: Rheumatology | Admitting: Rheumatology

## 2022-08-31 DIAGNOSIS — Z79899 Other long term (current) drug therapy: Secondary | ICD-10-CM | POA: Insufficient documentation

## 2022-08-31 DIAGNOSIS — L4050 Arthropathic psoriasis, unspecified: Secondary | ICD-10-CM | POA: Insufficient documentation

## 2022-08-31 DIAGNOSIS — Z8739 Personal history of other diseases of the musculoskeletal system and connective tissue: Secondary | ICD-10-CM | POA: Insufficient documentation

## 2022-08-31 DIAGNOSIS — M25561 Pain in right knee: Secondary | ICD-10-CM | POA: Insufficient documentation

## 2022-08-31 LAB — SYNOVIAL CELL COUNT + DIFF, W/ CRYSTALS
Eosinophils-Synovial: 0 %
Lymphocytes-Synovial Fld: 8 %
Monocyte-Macrophage-Synovial Fluid: 43 %
Neutrophil, Synovial: 49 %
WBC, Synovial: 473 /mm3 — ABNORMAL HIGH (ref 0–200)

## 2022-12-24 ENCOUNTER — Other Ambulatory Visit (INDEPENDENT_AMBULATORY_CARE_PROVIDER_SITE_OTHER): Payer: Self-pay | Admitting: Nurse Practitioner

## 2022-12-24 DIAGNOSIS — I714 Abdominal aortic aneurysm, without rupture, unspecified: Secondary | ICD-10-CM

## 2022-12-28 ENCOUNTER — Encounter (INDEPENDENT_AMBULATORY_CARE_PROVIDER_SITE_OTHER): Payer: Self-pay | Admitting: Vascular Surgery

## 2022-12-28 ENCOUNTER — Ambulatory Visit (INDEPENDENT_AMBULATORY_CARE_PROVIDER_SITE_OTHER): Payer: Medicare HMO | Admitting: Vascular Surgery

## 2022-12-28 ENCOUNTER — Ambulatory Visit (INDEPENDENT_AMBULATORY_CARE_PROVIDER_SITE_OTHER): Payer: Medicare HMO

## 2022-12-28 VITALS — BP 193/88 | HR 96 | Resp 16 | Wt 177.8 lb

## 2022-12-28 DIAGNOSIS — I10 Essential (primary) hypertension: Secondary | ICD-10-CM | POA: Diagnosis not present

## 2022-12-28 DIAGNOSIS — I723 Aneurysm of iliac artery: Secondary | ICD-10-CM

## 2022-12-28 DIAGNOSIS — I714 Abdominal aortic aneurysm, without rupture, unspecified: Secondary | ICD-10-CM

## 2022-12-28 NOTE — Assessment & Plan Note (Signed)
blood pressure control important in reducing the progression of atherosclerotic disease and aneurysmal growth. On appropriate oral medications.

## 2022-12-28 NOTE — Assessment & Plan Note (Signed)
Duplex today shows distal aorta measuring approximately 3.3 cm.  The right common iliac artery measures approximately 2.3 cm.  The left common iliac artery measures approximately 1.6 cm.  No role for intervention at this size.  Recheck in 6 months with duplex.  Blood pressure control is important in reducing the risk of growth of these aneurysms.

## 2022-12-28 NOTE — Progress Notes (Signed)
MRN : [id number]  William Tyler is a 88 y.o. (1935-04-20) male who presents with chief complaint of  Chief Complaint  Patient presents with   Follow-up    Ultrasound follow up  .  History of Present Illness: Patient returns today in follow up of his iliac artery aneurysms.  These have been known since approximately 2015.  He has no current aneurysm related symptoms. Specifically, the patient denies new back or abdominal pain, or signs of peripheral embolization.  Duplex today shows distal aorta measuring approximately 3.3 cm.  The right common iliac artery measures approximately 2.3 cm.  The left common iliac artery measures approximately 1.6 cm.  Current Outpatient Medications  Medication Sig Dispense Refill   allopurinol (ZYLOPRIM) 100 MG tablet 100 mg daily.     colchicine 0.6 MG tablet Take 0.6 mg by mouth daily.     furosemide (LASIX) 40 MG tablet Take 20 mg by mouth as needed.     gabapentin (NEURONTIN) 600 MG tablet Take 600 mg by mouth 3 (three) times daily.     GNP VITAMIN B-1 100 MG tablet      hydrochlorothiazide (HYDRODIURIL) 12.5 MG tablet hydrochlorothiazide 12.5 mg tablet     lansoprazole (PREVACID) 30 MG capsule Take 1 capsule by mouth 2 (two) times daily.     tamsulosin (FLOMAX) 0.4 MG CAPS capsule Take by mouth.     amLODipine (NORVASC) 10 MG tablet Take by mouth. (Patient not taking: Reported on 12/28/2022)     apixaban (ELIQUIS) 5 MG TABS tablet Take by mouth. (Patient not taking: Reported on 12/28/2022)     capsicum (ZOSTRIX) 0.075 % topical cream Apply 1 Application topically 2 (two) times daily. (Patient not taking: Reported on 12/28/2022) 28.3 g 0   fluticasone (FLONASE) 50 MCG/ACT nasal spray  (Patient not taking: Reported on 05/18/2022)     hydrocortisone (CORTEF) 20 MG tablet hydrocortisone 20 mg tablet (Patient not taking: Reported on 05/18/2022)     hydroxychloroquine (PLAQUENIL) 200 MG tablet Take 200 mg by mouth 2 (two) times daily. (Patient not taking:  Reported on 12/28/2022)     methotrexate (RHEUMATREX) 2.5 MG tablet methotrexate sodium 2.5 mg tablet (Patient not taking: Reported on 05/18/2022)     ondansetron (ZOFRAN) 4 MG tablet 4 mg every 8 (eight) hours as needed (Patient not taking: Reported on 06/18/2022)     No current facility-administered medications for this visit.    Past Medical History:  Diagnosis Date   GERD (gastroesophageal reflux disease)    Hypertension    Rheumatoid arteritis ([place])    Rheumatoid arthritis ([place])     Past Surgical History:  Procedure Laterality Date   APPENDECTOMY     BACK SURGERY     COLON SURGERY     HERNIA REPAIR     TONSILLECTOMY       Social History   Tobacco Use   Smoking status: Former   Smokeless tobacco: Never  Substance Use Topics   Alcohol use: No   Drug use: No      No family history on file.   Allergies  Allergen Reactions   Augmentin [Amoxicillin-Pot Clavulanate] Rash   Doxycycline Rash     REVIEW OF SYSTEMS (Negative unless checked)  Constitutional: []$ Weight loss  []$ Fever  []$ Chills Cardiac: []$ Chest pain   []$ Chest pressure   []$ Palpitations   []$ Shortness of breath when laying flat   []$ Shortness of breath at rest   []$ Shortness of breath with exertion. Vascular:  []$ Pain in legs with  walking   []$ Pain in legs at rest   []$ Pain in legs when laying flat   []$ Claudication   []$ Pain in feet when walking  []$ Pain in feet at rest  []$ Pain in feet when laying flat   []$ History of DVT   []$ Phlebitis   []$ Swelling in legs   []$ Varicose veins   []$ Non-healing ulcers Pulmonary:   []$ Uses home oxygen   []$ Productive cough   []$ Hemoptysis   []$ Wheeze  []$ COPD   []$ Asthma Neurologic:  []$ Dizziness  []$ Blackouts   []$ Seizures   []$ History of stroke   []$ History of TIA  []$ Aphasia   []$ Temporary blindness   []$ Dysphagia   []$ Weakness or numbness in arms   []$ Weakness or numbness in legs Musculoskeletal:  [x]$ Arthritis   []$ Joint swelling   [x]$ Joint pain   []$ Low back pain Hematologic:  []$ Easy bruising  []$ Easy  bleeding   []$ Hypercoagulable state   []$ Anemic   Gastrointestinal:  []$ Blood in stool   []$ Vomiting blood  [x]$ Gastroesophageal reflux/heartburn   []$ Abdominal pain Genitourinary:  []$ Chronic kidney disease   []$ Difficult urination  []$ Frequent urination  []$ Burning with urination   []$ Hematuria Skin:  []$ Rashes   []$ Ulcers   []$ Wounds Psychological:  []$ History of anxiety   []$  History of major depression.  Physical Examination  BP (!) 193/88 (BP Location: Right Arm)   Pulse 96   Resp 16   Wt 177 lb 12.8 oz (80.6 kg)   BMI 26.26 kg/m  Gen:  WD/WN, NAD. Appears younger than stated age. Head: [place]/AT, No temporalis wasting. Ear/Nose/Throat: Hearing grossly intact, nares w/o erythema or drainage Eyes: Conjunctiva clear. Sclera non-icteric Neck: Supple.  Trachea midline Pulmonary:  Good air movement, no use of accessory muscles.  Cardiac: RRR, no JVD Vascular:  Vessel Right Left  Radial Palpable Palpable                          PT Palpable Palpable  DP Palpable Palpable   Gastrointestinal: soft, non-tender/non-distended. No guarding/reflex. No increased aortic impulse Musculoskeletal: M/S 5/5 throughout.  No deformity or atrophy. No edema. Neurologic: Sensation grossly intact in extremities.  Symmetrical.  Speech is fluent.  Psychiatric: Judgment intact, Mood & affect appropriate for pt's clinical situation. Dermatologic: No rashes or ulcers noted.  No cellulitis or open wounds.      Labs No results found for this or any previous visit (from the past 2160 hour(s)).  Radiology No results found.  Assessment/Plan  Hypertension blood pressure control important in reducing the progression of atherosclerotic disease and aneurysmal growth. On appropriate oral medications.   Iliac artery aneurysm (HCC) Duplex today shows distal aorta measuring approximately 3.3 cm.  The right common iliac artery measures approximately 2.3 cm.  The left common iliac artery measures approximately 1.6 cm.   No role for intervention at this size.  Recheck in 6 months with duplex.  Blood pressure control is important in reducing the risk of growth of these aneurysms.    Leotis Pain, MD  12/28/2022 10:24 AM    This note was created with Dragon medical transcription system.  Any errors from dictation are purely unintentional

## 2023-06-28 ENCOUNTER — Encounter (INDEPENDENT_AMBULATORY_CARE_PROVIDER_SITE_OTHER): Payer: Self-pay | Admitting: Vascular Surgery

## 2023-06-28 ENCOUNTER — Ambulatory Visit (INDEPENDENT_AMBULATORY_CARE_PROVIDER_SITE_OTHER): Payer: Medicare HMO | Admitting: Vascular Surgery

## 2023-06-28 ENCOUNTER — Ambulatory Visit (INDEPENDENT_AMBULATORY_CARE_PROVIDER_SITE_OTHER): Payer: Medicare HMO

## 2023-06-28 VITALS — BP 124/76 | HR 77 | Resp 18 | Ht 69.0 in | Wt 162.0 lb

## 2023-06-28 DIAGNOSIS — I723 Aneurysm of iliac artery: Secondary | ICD-10-CM

## 2023-06-28 DIAGNOSIS — I10 Essential (primary) hypertension: Secondary | ICD-10-CM | POA: Diagnosis not present

## 2023-06-28 NOTE — Assessment & Plan Note (Signed)
blood pressure control important in reducing the progression of atherosclerotic disease and aneurysmal growth. On appropriate oral medications.  

## 2023-06-28 NOTE — Progress Notes (Signed)
MRN : [id number]  William Tyler is a 88 y.o. (1935-06-05) male who presents with chief complaint of  Chief Complaint  Patient presents with   Follow-up    f/u in 6 months with aaa  .    History of Present Illness: Patient returns today in follow up of Aortoiliac aneurysm.  He is doing well today.  He denies any focal neurologic symptoms.  He is doing well today.  He denies any aneurysm related symptoms. Specifically, the patient denies new back or abdominal pain, or signs of peripheral embolization.  Duplex today shows a stable 3.4 cm abdominal aortic aneurysm and a stable 2.2 cm right iliac artery aneurysm without significant progression from previous studies.  Current Outpatient Medications  Medication Sig Dispense Refill   allopurinol (ZYLOPRIM) 100 MG tablet 100 mg daily.     amLODipine (NORVASC) 10 MG tablet Take by mouth.     furosemide (LASIX) 40 MG tablet Take 20 mg by mouth as needed.     gabapentin (NEURONTIN) 600 MG tablet Take 600 mg by mouth 3 (three) times daily.     GNP VITAMIN B-1 100 MG tablet      hydroxychloroquine (PLAQUENIL) 200 MG tablet Take 200 mg by mouth 2 (two) times daily.     lansoprazole (PREVACID) 30 MG capsule Take 1 capsule by mouth 2 (two) times daily.     apixaban (ELIQUIS) 5 MG TABS tablet Take by mouth. (Patient not taking: Reported on 12/28/2022)     capsicum (ZOSTRIX) 0.075 % topical cream Apply 1 Application topically 2 (two) times daily. (Patient not taking: Reported on 12/28/2022) 28.3 g 0   colchicine 0.6 MG tablet Take 0.6 mg by mouth daily. (Patient not taking: Reported on 06/28/2023)     fluticasone (FLONASE) 50 MCG/ACT nasal spray  (Patient not taking: Reported on 05/18/2022)     hydrochlorothiazide (HYDRODIURIL) 12.5 MG tablet hydrochlorothiazide 12.5 mg tablet (Patient not taking: Reported on 06/28/2023)     hydrocortisone (CORTEF) 20 MG tablet hydrocortisone 20 mg tablet (Patient not taking: Reported on 05/18/2022)     No current  facility-administered medications for this visit.    Past Medical History:  Diagnosis Date   GERD (gastroesophageal reflux disease)    Hypertension    Rheumatoid arteritis (HCC)    Rheumatoid arthritis (HCC)     Past Surgical History:  Procedure Laterality Date   APPENDECTOMY     BACK SURGERY     COLON SURGERY     HERNIA REPAIR     TONSILLECTOMY       Social History   Tobacco Use   Smoking status: Former   Smokeless tobacco: Never  Substance Use Topics   Alcohol use: No   Drug use: No      History reviewed. No pertinent family history.   Allergies  Allergen Reactions   Augmentin [Amoxicillin-Pot Clavulanate] Rash   Doxycycline Rash     REVIEW OF SYSTEMS (Negative unless checked)  Constitutional: [] Weight loss  [] Fever  [] Chills Cardiac: [] Chest pain   [] Chest pressure   [] Palpitations   [] Shortness of breath when laying flat   [] Shortness of breath at rest   [] Shortness of breath with exertion. Vascular:  [] Pain in legs with walking   [] Pain in legs at rest   [] Pain in legs when laying flat   [] Claudication   [] Pain in feet when walking  [] Pain in feet at rest  [] Pain in feet when laying flat   [] History of DVT   []   Phlebitis   [] Swelling in legs   [] Varicose veins   [] Non-healing ulcers Pulmonary:   [] Uses home oxygen   [] Productive cough   [] Hemoptysis   [] Wheeze  [] COPD   [] Asthma Neurologic:  [] Dizziness  [] Blackouts   [] Seizures   [] History of stroke   [] History of TIA  [] Aphasia   [] Temporary blindness   [] Dysphagia   [] Weakness or numbness in arms   [] Weakness or numbness in legs Musculoskeletal:  [x] Arthritis   [] Joint swelling   [x] Joint pain   [] Low back pain Hematologic:  [] Easy bruising  [] Easy bleeding   [] Hypercoagulable state   [] Anemic   Gastrointestinal:  [] Blood in stool   [] Vomiting blood  [x] Gastroesophageal reflux/heartburn   [] Abdominal pain Genitourinary:  [] Chronic kidney disease   [] Difficult urination  [] Frequent urination  [] Burning with  urination   [] Hematuria Skin:  [] Rashes   [] Ulcers   [] Wounds Psychological:  [] History of anxiety   []  History of major depression.  Physical Examination  BP 124/76 (BP Location: Right Arm)   Pulse 77   Resp 18   Ht 5\' 9"  (1.753 m)   Wt 162 lb (73.5 kg)   BMI 23.92 kg/m  Gen:  WD/WN, NAD. Appears younger than stated age. Head: [place]/AT, No temporalis wasting. Ear/Nose/Throat: Hearing grossly intact, nares w/o erythema or drainage Eyes: Conjunctiva clear. Sclera non-icteric Neck: Supple.  Trachea midline Pulmonary:  Good air movement, no use of accessory muscles.  Cardiac: RRR, no JVD Vascular:  Vessel Right Left  Radial Palpable Palpable                          PT Palpable Palpable  DP Palpable Palpable   Gastrointestinal: soft, non-tender/non-distended. No guarding/reflex.  Musculoskeletal: M/S 5/5 throughout.  No deformity or atrophy. No edema. Neurologic: Sensation grossly intact in extremities.  Symmetrical.  Speech is fluent.  Psychiatric: Judgment intact, Mood & affect appropriate for pt's clinical situation. Dermatologic: No rashes or ulcers noted.  No cellulitis or open wounds.      Labs No results found for this or any previous visit (from the past 2160 hour(s)).  Radiology No results found.  Assessment/Plan  Hypertension blood pressure control important in reducing the progression of atherosclerotic disease and aneurysmal growth. On appropriate oral medications.   Iliac artery aneurysm (HCC) Duplex today shows a stable 3.4 cm abdominal aortic aneurysm and a stable 2.2 cm right iliac artery aneurysm without significant progression from previous studies.  We discussed with him today that at this size, there is no role for intervention.  The risk of rupture is less than 1 %/year.  We will continue to follow this on 6 to 91-month intervals and he will contact our office with any problems in the interim.    Festus Barren, MD  06/28/2023 12:37  PM    This note was created with Dragon medical transcription system.  Any errors from dictation are purely unintentional

## 2023-06-28 NOTE — Assessment & Plan Note (Signed)
Duplex today shows a stable 3.4 cm abdominal aortic aneurysm and a stable 2.2 cm right iliac artery aneurysm without significant progression from previous studies.  We discussed with him today that at this size, there is no role for intervention.  The risk of rupture is less than 1 %/year.  We will continue to follow this on 6 to 38-month intervals and he will contact our office with any problems in the interim.

## 2024-01-03 ENCOUNTER — Ambulatory Visit (INDEPENDENT_AMBULATORY_CARE_PROVIDER_SITE_OTHER): Payer: Medicare HMO | Admitting: Vascular Surgery

## 2024-01-03 ENCOUNTER — Ambulatory Visit (INDEPENDENT_AMBULATORY_CARE_PROVIDER_SITE_OTHER): Payer: Medicare HMO

## 2024-01-03 ENCOUNTER — Encounter (INDEPENDENT_AMBULATORY_CARE_PROVIDER_SITE_OTHER): Payer: Self-pay | Admitting: Vascular Surgery

## 2024-01-03 VITALS — BP 129/80 | HR 77 | Resp 16 | Wt 180.0 lb

## 2024-01-03 DIAGNOSIS — I723 Aneurysm of iliac artery: Secondary | ICD-10-CM | POA: Diagnosis not present

## 2024-01-03 DIAGNOSIS — I10 Essential (primary) hypertension: Secondary | ICD-10-CM

## 2024-01-03 NOTE — Assessment & Plan Note (Signed)
blood pressure control important in reducing the progression of atherosclerotic disease and aneurysmal growth. On appropriate oral medications.  

## 2024-01-03 NOTE — Assessment & Plan Note (Signed)
 Duplex today shows roughly stable size of his abdominal aortic aneurysm measuring 3.5 cm in maximal diameter and his largest iliac artery diameter is just over 2 cm on the right today which is also stable.  No role for intervention at this size.  Blood pressure control is important for reducing aneurysmal growth.  Follow-up in 6 months with duplex.

## 2024-01-03 NOTE — Progress Notes (Signed)
 MRN : [id number]  William Tyler is a 89 y.o. ([birth year]) male who presents with chief complaint of  Chief Complaint  Patient presents with   Follow-up    6 month AAA follow up  .  History of Present Illness: Patient returns today in follow up of aorta and iliac artery aneurysms.  He is doing well.  He denies any aneurysm related symptoms. Specifically, the patient denies new back or abdominal pain, or signs of peripheral embolization.  Duplex today shows roughly stable size of his abdominal aortic aneurysm measuring 3.5 cm in maximal diameter and his largest iliac artery diameter is just over 2 cm on the right today which is also stable.  Current Outpatient Medications  Medication Sig Dispense Refill   amLODipine (NORVASC) 10 MG tablet Take 10 mg by mouth every other day.     apixaban (ELIQUIS) 5 MG TABS tablet Take by mouth.     ferrous sulfate 325 (65 FE) MG EC tablet Take 325 mg by mouth 3 (three) times daily with meals.     GNP VITAMIN B-1 100 MG tablet      hydroxychloroquine (PLAQUENIL) 200 MG tablet Take 200 mg by mouth 2 (two) times daily.     lansoprazole (PREVACID) 30 MG capsule Take 1 capsule by mouth 2 (two) times daily.     pravastatin (PRAVACHOL) 40 MG tablet Take 40 mg by mouth daily.     tamsulosin (FLOMAX) 0.4 MG CAPS capsule Take 0.4 mg by mouth daily.     allopurinol (ZYLOPRIM) 100 MG tablet 100 mg daily. (Patient not taking: Reported on 01/03/2024)     capsicum (ZOSTRIX) 0.075 % topical cream Apply 1 Application topically 2 (two) times daily. (Patient not taking: Reported on 12/28/2022) 28.3 g 0   colchicine 0.6 MG tablet Take 0.6 mg by mouth daily. (Patient not taking: Reported on 01/03/2024)     fluticasone (FLONASE) 50 MCG/ACT nasal spray  (Patient not taking: Reported on 01/03/2024)     furosemide (LASIX) 40 MG tablet Take 20 mg by mouth as needed. (Patient not taking: Reported on 01/03/2024)     gabapentin (NEURONTIN) 600 MG tablet Take 600 mg by mouth 3 (three)  times daily. (Patient not taking: Reported on 01/03/2024)     hydrochlorothiazide (HYDRODIURIL) 12.5 MG tablet hydrochlorothiazide 12.5 mg tablet (Patient not taking: Reported on 01/03/2024)     hydrocortisone (CORTEF) 20 MG tablet hydrocortisone 20 mg tablet (Patient not taking: Reported on 01/03/2024)     No current facility-administered medications for this visit.    Past Medical History:  Diagnosis Date   GERD (gastroesophageal reflux disease)    Hypertension    Rheumatoid arteritis (HCC)    Rheumatoid arthritis (HCC)     Past Surgical History:  Procedure Laterality Date   APPENDECTOMY     BACK SURGERY     COLON SURGERY     HERNIA REPAIR     TONSILLECTOMY       Social History   Tobacco Use   Smoking status: Former   Smokeless tobacco: Never  Substance Use Topics   Alcohol use: No   Drug use: No       Family History  Problem Relation Age of Onset   Kidney cancer Mother    Hypertension Father    Heart attack Father    AAA (abdominal aortic aneurysm) Brother      Allergies  Allergen Reactions   Augmentin [Amoxicillin-Pot Clavulanate] Rash   Doxycycline Rash  REVIEW OF SYSTEMS (Negative unless checked)  Constitutional: [] Weight loss  [] Fever  [] Chills Cardiac: [] Chest pain   [] Chest pressure   [] Palpitations   [] Shortness of breath when laying flat   [] Shortness of breath at rest   [] Shortness of breath with exertion. Vascular:  [] Pain in legs with walking   [] Pain in legs at rest   [] Pain in legs when laying flat   [] Claudication   [] Pain in feet when walking  [] Pain in feet at rest  [] Pain in feet when laying flat   [] History of DVT   [] Phlebitis   [] Swelling in legs   [] Varicose veins   [] Non-healing ulcers Pulmonary:   [] Uses home oxygen   [] Productive cough   [] Hemoptysis   [] Wheeze  [] COPD   [] Asthma Neurologic:  [] Dizziness  [] Blackouts   [] Seizures   [] History of stroke   [] History of TIA  [] Aphasia   [] Temporary blindness   [] Dysphagia   [] Weakness  or numbness in arms   [] Weakness or numbness in legs Musculoskeletal:  [x] Arthritis   [] Joint swelling   [x] Joint pain   [] Low back pain Hematologic:  [] Easy bruising  [] Easy bleeding   [] Hypercoagulable state   [] Anemic   Gastrointestinal:  [] Blood in stool   [] Vomiting blood  [x] Gastroesophageal reflux/heartburn   [] Abdominal pain Genitourinary:  [] Chronic kidney disease   [] Difficult urination  [] Frequent urination  [] Burning with urination   [] Hematuria Skin:  [] Rashes   [] Ulcers   [] Wounds Psychological:  [] History of anxiety   []  History of major depression.  Physical Examination  BP 129/80   Pulse 77   Resp 16   Wt 180 lb (81.6 kg)   BMI 26.58 kg/m  Gen:  WD/WN, NAD. Appears younger than stated age. Head: [place]/AT, No temporalis wasting. Ear/Nose/Throat: Hearing grossly intact, nares w/o erythema or drainage Eyes: Conjunctiva clear. Sclera non-icteric Neck: Supple.  Trachea midline Pulmonary:  Good air movement, no use of accessory muscles.  Cardiac: RRR, no JVD Vascular:  Vessel Right Left  Radial Palpable Palpable           Musculoskeletal: M/S 5/5 throughout.  No deformity or atrophy. No edema. Neurologic: Sensation grossly intact in extremities.  Symmetrical.  Speech is fluent.  Psychiatric: Judgment intact, Mood & affect appropriate for pt's clinical situation. Dermatologic: No rashes or ulcers noted.  No cellulitis or open wounds.      Labs No results found for this or any previous visit (from the past 2160 hours).  Radiology No results found.  Assessment/Plan  Hypertension blood pressure control important in reducing the progression of atherosclerotic disease and aneurysmal growth. On appropriate oral medications.   Iliac artery aneurysm (HCC) Duplex today shows roughly stable size of his abdominal aortic aneurysm measuring 3.5 cm in maximal diameter and his largest iliac artery diameter is just over 2 cm on the right today which is also stable.  No role  for intervention at this size.  Blood pressure control is important for reducing aneurysmal growth.  Follow-up in 6 months with duplex.    Festus Barren, MD  01/03/2024 12:06 PM    This note was created with Dragon medical transcription system.  Any errors from dictation are purely unintentional

## 2024-07-10 ENCOUNTER — Other Ambulatory Visit (INDEPENDENT_AMBULATORY_CARE_PROVIDER_SITE_OTHER): Payer: Medicare HMO

## 2024-07-10 ENCOUNTER — Ambulatory Visit (INDEPENDENT_AMBULATORY_CARE_PROVIDER_SITE_OTHER): Payer: Medicare HMO | Admitting: Vascular Surgery
